# Patient Record
Sex: Male | Born: 1955 | Hispanic: Yes | Marital: Single | Smoking: Current some day smoker
Health system: Southern US, Community
[De-identification: ages and names within clinical notes are randomized; demographics above are authoritative.]

## PROBLEM LIST (undated history)

## (undated) DIAGNOSIS — I10 Essential (primary) hypertension: Secondary | ICD-10-CM

---

## 2017-10-12 ENCOUNTER — Ambulatory Visit (HOSPITAL_COMMUNITY)
Admission: EM | Admit: 2017-10-12 | Discharge: 2017-10-12 | Disposition: A | Discharge status: Home / Self Care | Payer: Self-pay | Attending: Emergency Medicine | Admitting: Emergency Medicine

## 2017-10-12 ENCOUNTER — Other Ambulatory Visit: Payer: Self-pay

## 2017-10-12 ENCOUNTER — Encounter (HOSPITAL_COMMUNITY): Payer: Self-pay | Admitting: Emergency Medicine

## 2017-10-12 DIAGNOSIS — M545 Low back pain: Secondary | ICD-10-CM

## 2017-10-12 DIAGNOSIS — S39012A Strain of muscle, fascia and tendon of lower back, initial encounter: Secondary | ICD-10-CM

## 2017-10-12 HISTORY — DX: Essential (primary) hypertension: I10

## 2017-10-12 MED ORDER — HYDROCODONE-ACETAMINOPHEN 5-325 MG PO TABS
1.0000 | ORAL_TABLET | Freq: Once | ORAL | Status: AC
  Administered 2017-10-12: 1 via ORAL

## 2017-10-12 MED ORDER — HYDROCODONE-ACETAMINOPHEN 5-325 MG PO TABS
ORAL_TABLET | ORAL | Status: AC
  Filled 2017-10-12: qty 1

## 2017-10-12 MED ORDER — KETOROLAC TROMETHAMINE 60 MG/2ML IM SOLN
INTRAMUSCULAR | Status: AC
Start: 2017-10-12 — End: 2017-10-12
  Filled 2017-10-12: qty 2

## 2017-10-12 MED ORDER — KETOROLAC TROMETHAMINE 60 MG/2ML IM SOLN
45.0000 mg | Freq: Once | INTRAMUSCULAR | Status: AC
  Administered 2017-10-12: 45 mg via INTRAMUSCULAR

## 2017-10-12 MED ORDER — NAPROXEN 375 MG PO TABS: 375.0000 mg | ORAL_TABLET | Freq: Two times a day (BID) | ORAL | 0 refills | Status: DC

## 2017-10-12 MED ORDER — TRAMADOL HCL 50 MG PO TABS: 50.0000 mg | ORAL_TABLET | Freq: Four times a day (QID) | ORAL | 0 refills | Status: DC | PRN

## 2017-10-12 NOTE — ED Provider Notes (Signed)
[facility]    CSN: [id number] Arrival date & time: 10/12/17  1434     History   Chief Complaint Chief Complaint  Patient presents with  . Back Pain  . Leg Pain    left    HPI Luke Jones is a 61 y.o. male.   Video interpreter used. 61 year old spending male presents with pain in his left low back that radiates to the foot. He states he feels like a nerve pain. This started 6 days ago. He describes it as a throbbing pain as well. He said moving makes it worse. Attempting to ambulate produces pain. The act of sitting and standing also increases pain. He has not taken any pain medications or apply other treatments. Denies trauma, falls or other known injury.      Past Medical History:  Diagnosis Date  . Hypertension     There are no active problems to display for this patient.   History reviewed. No pertinent surgical history.     Home Medications    Prior to Admission medications   Medication Sig Start Date End Date Taking? Authorizing Provider  naproxen (NAPROSYN) 375 MG tablet Take 1 tablet (375 mg total) by mouth 2 (two) times daily. 10/12/17   Hayden RasmussenMabe, Elizette Shek, NP  traMADol (ULTRAM) 50 MG tablet Take 1 tablet (50 mg total) by mouth every 6 (six) hours as needed. 10/12/17   Hayden RasmussenMabe, Donnetta Gillin, NP    Family History History reviewed. No pertinent family history.  Social History Social History   Tobacco Use  . Smoking status: Current Some Day Smoker    Types: Cigarettes  . Smokeless tobacco: Never Used  Substance Use Topics  . Alcohol use: Yes    Frequency: Never  . Drug use: No     Allergies   Patient has no known allergies.   Review of Systems Review of Systems  Constitutional: Positive for activity change.  Respiratory: Negative.   Gastrointestinal: Negative.   Genitourinary: Negative.   Musculoskeletal: Positive for back pain, gait problem and myalgias. Negative for joint swelling, neck pain and neck stiffness.       As per HPI    Skin: Negative.   Neurological: Negative for dizziness, weakness, numbness and headaches.  All other systems reviewed and are negative.    Physical Exam Triage Vital Signs ED Triage Vitals  Enc Vitals Group     BP 10/12/17 1501 119/79     Pulse Rate 10/12/17 1501 78     Resp --      Temp 10/12/17 1501 98.2 F (36.8 C)     Temp Source 10/12/17 1501 Oral     SpO2 10/12/17 1501 97 %     Weight --      Height --      Head Circumference --      Peak Flow --      Pain Score 10/12/17 1456 9     Pain Loc --      Pain Edu? --      Excl. in GC? --    No data found.  Updated Vital Signs BP 119/79 (BP Location: Left Arm)   Pulse 78   Temp 98.2 F (36.8 C) (Oral)   SpO2 97%   Visual Acuity Right Eye Distance:   Left Eye Distance:   Bilateral Distance:    Right Eye Near:   Left Eye Near:    Bilateral Near:     Physical Exam  Constitutional: He is oriented to person, place,  and time. He appears well-developed and well-nourished.  HENT:  Head: Normocephalic and atraumatic.  Eyes: EOM are normal. Left eye exhibits no discharge.  Neck: Normal range of motion. Neck supple.  Pulmonary/Chest: Effort normal.  Musculoskeletal: He exhibits tenderness. He exhibits no edema or deformity.  Marked tenderness to the left para lumbar musculature starting at the spine. Tenderness extends into the buttock as well as the posterior thigh musculature. No direct spinal tenderness, deformity, step-off deformity, swelling or discoloration.  Neurological: He is alert and oriented to person, place, and time. No cranial nerve deficit.  Skin: Skin is warm and dry.  Psychiatric: He has a normal mood and affect.  Nursing note and vitals reviewed.    UC Treatments / Results  Labs (all labs ordered are listed, but only abnormal results are displayed) Labs Reviewed - No data to display  EKG  EKG Interpretation None       Radiology No results found.  Procedures Procedures (including  critical care time)  Medications Ordered in UC Medications  ketorolac (TORADOL) injection 45 mg (not administered)  HYDROcodone-acetaminophen (NORCO/VICODIN) 5-325 MG per tablet 1 tablet (not administered)     Initial Impression / Assessment and Plan / UC Course  I have reviewed the triage vital signs and the nursing notes.  Pertinent labs & imaging results that were available during my care of the patient were reviewed by me and considered in my medical decision making (see chart for details).    Meds as directed Heat to muscles    Final Clinical Impressions(s) / UC Diagnoses   Final diagnoses:  Strain of lumbar region, initial encounter  Acute left-sided low back pain, with sciatica presence unspecified    ED Discharge Orders        Ordered    naproxen (NAPROSYN) 375 MG tablet  2 times daily     10/12/17 1645    traMADol (ULTRAM) 50 MG tablet  Every 6 hours PRN     10/12/17 1645       Controlled Substance Prescriptions [place] Controlled Substance Registry consulted? Not Applicable   Hayden RasmussenMabe, Shaundra Fullam, NP 10/12/17 [phone]

## 2017-10-12 NOTE — ED Triage Notes (Addendum)
Pt reports left lower back pain that radiates all the way down his left leg.  9/10 during the day and passes a 10/10 at night.  Interpreter Arlyn DunningMaggie Mena used for assessment.

## 2017-10-12 NOTE — Discharge Instructions (Signed)
Meds as directed Heat to muscles

## 2017-10-31 ENCOUNTER — Encounter (HOSPITAL_COMMUNITY): Payer: Self-pay | Admitting: Emergency Medicine

## 2017-10-31 ENCOUNTER — Ambulatory Visit (HOSPITAL_COMMUNITY)
Admission: EM | Admit: 2017-10-31 | Discharge: 2017-10-31 | Disposition: A | Discharge status: Home / Self Care | Payer: Self-pay | Attending: Family Medicine | Admitting: Family Medicine

## 2017-10-31 DIAGNOSIS — M5442 Lumbago with sciatica, left side: Secondary | ICD-10-CM

## 2017-10-31 DIAGNOSIS — H6121 Impacted cerumen, right ear: Secondary | ICD-10-CM

## 2017-10-31 MED ORDER — PREDNISONE 10 MG (21) PO TBPK: ORAL_TABLET | Freq: Every day | ORAL | 0 refills | Status: DC

## 2017-10-31 MED ORDER — NAPROXEN 375 MG PO TABS: 375.0000 mg | ORAL_TABLET | Freq: Two times a day (BID) | ORAL | 0 refills | Status: DC

## 2017-10-31 NOTE — Discharge Instructions (Signed)
Please take naproxen twice a day. Course of prednisone as prescribed. Please establish with a primary care provider as you may need further evaluation and treatment for this if it persists. Please go to er if develop leg weakness, numbness, or incontinence of urine or stool

## 2017-10-31 NOTE — ED Provider Notes (Signed)
[facility]    CSN: [id number] Arrival date & time: 10/31/17  1008     History   Chief Complaint Chief Complaint  Patient presents with  . Back Pain    HPI Luke Jones is a 62 y.o. male.   Handy presents with persistent low back pain which radiates down left thigh. Spanish interpreter used to collect history and physical. This has been ongoing for approximately 1 month. He was seen 12/27 and started on naproxen which minimally helped. Pain is worse with use of left leg. Denies any specific known injury. Patient is typically quite active, works in [employer]. Has not been working over the past month. Has not taken naproxen in 1 week. Denies incontinence of urine or stool. Radiating pain to left thigh with tingling sensation. Without gross numbness. Without fall or injury. Pain with ambulation but he is ambulatory. Also complains of noise to ears like they are clogged. Without headache, dizziness, neck pain, congestion. Has not had his hearing checked. Without pain or ringing to ears.    ROS per HPI.        Past Medical History:  Diagnosis Date  . Hypertension     There are no active problems to display for this patient.   History reviewed. No pertinent surgical history.     Home Medications    Prior to Admission medications   Medication Sig Start Date End Date Taking? Authorizing Provider  traMADol (ULTRAM) 50 MG tablet Take 1 tablet (50 mg total) by mouth every 6 (six) hours as needed. 10/12/17  Yes Mabe, Onalee Hua, NP  naproxen (NAPROSYN) 375 MG tablet Take 1 tablet (375 mg total) by mouth 2 (two) times daily. 10/31/17   Georgetta Haber, NP  predniSONE (STERAPRED UNI-PAK 21 TAB) 10 MG (21) TBPK tablet Take by mouth daily. Take 6 tabs by mouth daily  for 2 days, then 5 tabs for 2 days, then 4 tabs for 2 days, then 3 tabs for 2 days, 2 tabs for 2 days, then 1 tab by mouth daily for 2 days 10/31/17   Georgetta Haber, NP    Family History History  reviewed. No pertinent family history.  Social History Social History   Tobacco Use  . Smoking status: Current Some Day Smoker    Types: Cigarettes  . Smokeless tobacco: Never Used  Substance Use Topics  . Alcohol use: Yes    Frequency: Never  . Drug use: No     Allergies   Patient has no known allergies.   Review of Systems Review of Systems   Physical Exam Triage Vital Signs ED Triage Vitals  Enc Vitals Group     BP 10/31/17 1044 (!) 143/80     Pulse Rate 10/31/17 1044 62     Resp 10/31/17 1044 20     Temp 10/31/17 1044 98.2 F (36.8 C)     Temp Source 10/31/17 1044 Oral     SpO2 10/31/17 1044 97 %     Weight --      Height --      Head Circumference --      Peak Flow --      Pain Score 10/31/17 1046 9     Pain Loc --      Pain Edu? --      Excl. in GC? --    No data found.  Updated Vital Signs BP (!) 143/80 (BP Location: Left Arm)   Pulse 62   Temp 98.2 F (36.8 C) (  Oral)   Resp 20   SpO2 97%   Visual Acuity Right Eye Distance:   Left Eye Distance:   Bilateral Distance:    Right Eye Near:   Left Eye Near:    Bilateral Near:     Physical Exam  Constitutional: He is oriented to person, place, and time. He appears well-developed and well-nourished.  HENT:  Left Ear: Hearing, tympanic membrane and ear canal normal.  Right canal with cerumen occlusion;  Cardiovascular: Normal rate and regular rhythm.  Pulmonary/Chest: Effort normal and breath sounds normal.  Musculoskeletal:       Lumbar back: He exhibits tenderness, bony tenderness, spasm and abnormal pulse. He exhibits normal range of motion, no laceration and no pain.       Back:  Left midline and low back pain which radiates to hip, groin and thigh; pain with palpation; pain with left hip flexion and mildly with straight leg raise; sensation intact; strength equal bilaterally; tolerates toe and heel ambulation; equal reflexes bilaterally  Neurological: He is alert and oriented to person,  place, and time.  Skin: Skin is warm and dry.     UC Treatments / Results  Labs (all labs ordered are listed, but only abnormal results are displayed) Labs Reviewed - No data to display  EKG  EKG Interpretation None       Radiology No results found.  Procedures Procedures (including critical care time)  Medications Ordered in UC Medications - No data to display   Initial Impression / Assessment and Plan / UC Course  I have reviewed the triage vital signs and the nursing notes.  Pertinent labs & imaging results that were available during my care of the patient were reviewed by me and considered in my medical decision making (see chart for details).     Prednisone burst back, naproxen with food bid. Light regular activity. Ears irrigated for cerumen today in clinic. Recommended establish with PCP for long term managemgent of back pain as may need imaging, p/t, injections or referral in the future if pain not managed. Patient verbalized understanding and agreeable to plan.    Final Clinical Impressions(s) / UC Diagnoses   Final diagnoses:  Left-sided low back pain with left-sided sciatica, unspecified chronicity    ED Discharge Orders        Ordered    naproxen (NAPROSYN) 375 MG tablet  2 times daily     10/31/17 1110    predniSONE (STERAPRED UNI-PAK 21 TAB) 10 MG (21) TBPK tablet  Daily     10/31/17 1110       Controlled Substance Prescriptions [place] Controlled Substance Registry consulted? Not Applicable   Georgetta HaberBurky, Genell Thede B, NP 10/31/17 1127

## 2017-10-31 NOTE — ED Triage Notes (Signed)
PT C/O: persistent left hip pain that radiates down his left leg  Was seen her on 10/12/17 for similar sx and dx w/sciatic pain  Reports pain alleviated temporarily  DENIES: Inj/trauma  TAKING MEDS: Tramadol and Naproxen   A&O x4... NAD... Ambulatory

## 2017-12-30 ENCOUNTER — Encounter (HOSPITAL_COMMUNITY): Payer: Self-pay

## 2017-12-30 ENCOUNTER — Other Ambulatory Visit: Payer: Self-pay

## 2017-12-30 ENCOUNTER — Ambulatory Visit (INDEPENDENT_AMBULATORY_CARE_PROVIDER_SITE_OTHER): Payer: Self-pay

## 2017-12-30 ENCOUNTER — Ambulatory Visit (HOSPITAL_COMMUNITY)
Admission: EM | Admit: 2017-12-30 | Discharge: 2017-12-30 | Disposition: A | Discharge status: Home / Self Care | Payer: Self-pay | Attending: Family Medicine | Admitting: Family Medicine

## 2017-12-30 DIAGNOSIS — M5416 Radiculopathy, lumbar region: Secondary | ICD-10-CM

## 2017-12-30 DIAGNOSIS — M5410 Radiculopathy, site unspecified: Secondary | ICD-10-CM

## 2017-12-30 DIAGNOSIS — M545 Low back pain: Secondary | ICD-10-CM

## 2017-12-30 MED ORDER — NAPROXEN 500 MG PO TABS: 500.0000 mg | ORAL_TABLET | Freq: Two times a day (BID) | ORAL | 0 refills | Status: AC

## 2017-12-30 MED ORDER — TRAMADOL HCL 50 MG PO TABS: 50.0000 mg | ORAL_TABLET | Freq: Four times a day (QID) | ORAL | 0 refills | Status: AC | PRN

## 2017-12-30 NOTE — ED Triage Notes (Signed)
Pt presents today with left hip pain and radiates down left leg was seen for this issue on 10/31/17. Same sxs has not gotten worse but feels the same.

## 2017-12-30 NOTE — ED Provider Notes (Signed)
[facility]    CSN: [id number] Arrival date & time: 12/30/17  1539     History   Chief Complaint Chief Complaint  Patient presents with  . Leg Pain    HPI [employer] interpreter used for Spanish translation. Luke Jones is a 62 y.o. male history of hypertension presenting today with back pain.  States that he has back pain in the lower middle area of his back that radiates down into his left leg and left groin.  Denies loss of bowel or bladder control, denies saddle anesthesia.  Denies numbness or tingling, mainly just pain radiating.  Symptoms have been going on for approximately 3-4 months, initially seen here in December and given Toradol and naproxen.  Patient states that he had some pain relief with the naproxen, but pain was still present.  He was seen here in January for the same back pain, sent home with prednisone pack, patient denies any relief with this.  Patient presenting today because the pain has been persistent and no longer has any naproxen.  Patient and family member requesting x-ray.   Pain worsens when lifting left leg/walking.  Patient is using crutch to ambulate.  HPI  Past Medical History:  Diagnosis Date  . Hypertension     There are no active problems to display for this patient.   History reviewed. No pertinent surgical history.     Home Medications    Prior to Admission medications   Medication Sig Start Date End Date Taking? Authorizing Provider  naproxen (NAPROSYN) 500 MG tablet Take 1 tablet (500 mg total) by mouth 2 (two) times daily. 12/30/17   Wieters, Hallie C, PA-C  traMADol (ULTRAM) 50 MG tablet Take 1 tablet (50 mg total) by mouth every 6 (six) hours as needed for up to 3 days. 12/30/17 01/02/18  Wieters, Junius Creamer, PA-C    Family History History reviewed. No pertinent family history.  Social History Social History   Tobacco Use  . Smoking status: Current Some Day Smoker    Types: Cigarettes  . Smokeless tobacco:  Never Used  Substance Use Topics  . Alcohol use: Yes    Frequency: Never  . Drug use: No     Allergies   Patient has no known allergies.   Review of Systems Review of Systems  Respiratory: Negative for shortness of breath.   Cardiovascular: Negative for chest pain.  Gastrointestinal: Negative for abdominal pain, constipation, diarrhea, nausea and vomiting.  Genitourinary: Negative for decreased urine volume, difficulty urinating and hematuria.  Musculoskeletal: Positive for back pain, gait problem and myalgias. Negative for arthralgias.  Skin: Negative for color change and rash.  Neurological: Negative for dizziness, weakness, light-headedness, numbness and headaches.  All other systems reviewed and are negative.    Physical Exam Triage Vital Signs ED Triage Vitals  Enc Vitals Group     BP 12/30/17 1649 131/90     Pulse Rate 12/30/17 1649 68     Resp 12/30/17 1649 18     Temp 12/30/17 1649 97.6 F (36.4 C)     Temp Source 12/30/17 1649 Oral     SpO2 12/30/17 1649 95 %     Weight --      Height --      Head Circumference --      Peak Flow --      Pain Score 12/30/17 1720 9     Pain Loc --      Pain Edu? --      Excl.  in GC? --    No data found.  Updated Vital Signs BP 131/90 (BP Location: Left Arm)   Pulse 68   Temp 97.6 F (36.4 C) (Oral)   Resp 18   SpO2 95%   Visual Acuity Right Eye Distance:   Left Eye Distance:   Bilateral Distance:    Right Eye Near:   Left Eye Near:    Bilateral Near:     Physical Exam  Constitutional: He is oriented to person, place, and time. He appears well-developed and well-nourished.  Patient able to ambulate from chair to exam table with minimal difficulty  HENT:  Head: Normocephalic and atraumatic.  Eyes: Conjunctivae are normal.  Neck: Neck supple.  Cardiovascular: Normal rate and regular rhythm.  No murmur heard. Pulmonary/Chest: Effort normal. No respiratory distress.  Musculoskeletal: He exhibits no edema or  tenderness.  Pain nonreproducible on exam, nontender to palpation of thoracic and lumbar spine.  Patient pointing to lower lumbar/sacral area as source of pain.  Nontender to palpation of paraspinal muscles and lateral musculature bilaterally.  Pain is elicited with straight leg raise on left side into thigh, denies numbness or tingling with straight leg raise.  Neurological: He is alert and oriented to person, place, and time.  Skin: Skin is warm and dry.  Psychiatric: He has a normal mood and affect.  Nursing note and vitals reviewed.    UC Treatments / Results  Labs (all labs ordered are listed, but only abnormal results are displayed) Labs Reviewed - No data to display  EKG  EKG Interpretation None       Radiology Dg Lumbar Spine Complete  Result Date: 12/30/2017 CLINICAL DATA:  62 y/o M; 3 months of lower back pain. Possibly secondary to a fall. Pain originates around the L3 region radiates to the groin and down the left leg. EXAM: LUMBAR SPINE - COMPLETE 4+ VIEW COMPARISON:  None. FINDINGS: No loss of vertebral body height. Grade 1/2 L5-S1 anterolisthesis with L5 pars defects. Multilevel small anterior marginal osteophytes. Intervertebral disc space heights are maintained. Abdominal aortic calcific atherosclerosis. IMPRESSION: 1. Grade 1/2 L5-S1 anterolisthesis with L5 pars defects. 2. Vertebral body and disc space heights are maintained. 3. Aortic atherosclerosis. Electronically Signed   By: Mitzi Hansen M.D.   On: 12/30/2017 18:22    Procedures Procedures (including critical care time)  Medications Ordered in UC Medications - No data to display   Initial Impression / Assessment and Plan / UC Course  I have reviewed the triage vital signs and the nursing notes.  Pertinent labs & imaging results that were available during my care of the patient were reviewed by me and considered in my medical decision making (see chart for details).     Patient with  persistent back pain, failing anti-inflammatories and prednisone.  Will get lumbar x-ray given this is patient's third visit, and requesting.  No red flags.  Lumbar spine showing osteophytes and anterolisthesis.  Will treat pain with naproxen for mild to moderate, tramadol for severe pain.  Discussed with patient about losing weight to take strain off of his back.  Also getting set up with community health and wellness/orthopedics for further treatment and evaluation.  Final Clinical Impressions(s) / UC Diagnoses   Final diagnoses:  Back pain with left-sided radiculopathy    ED Discharge Orders        Ordered    naproxen (NAPROSYN) 500 MG tablet  2 times daily     12/30/17 1822    traMADol (  ULTRAM) 50 MG tablet  Every 6 hours PRN     12/30/17 1822       Controlled Substance Prescriptions [place] Controlled Substance Registry consulted? Not Applicable registry checked, one previous prescription in December.   Lew DawesWieters, Hallie C, [place]PA-C 12/30/17 1845

## 2017-12-30 NOTE — Discharge Instructions (Signed)
Please work on losing weight- eating healthier and exercising as tolerated to take a strain off your back.  Please use naproxen for mild- moderate pain  Please use tramadol for severe pain  Please also ice and heat back multiple times a day.   Please establish care with community health and wellness for referral to orthopedics or follow up with orthopedics.

## 2019-05-05 IMAGING — DX DG LUMBAR SPINE COMPLETE 4+V
5 series · 5 of 5 positions shown · non-contrast
Comparison: None.

CLINICAL DATA: 61 y/o M; 3 months of lower back pain. Possibly
secondary to a fall. Pain originates around the L3 region radiates
to the groin and down the left leg.

EXAM:
LUMBAR SPINE - COMPLETE 4+ VIEW

[l-spine ap]
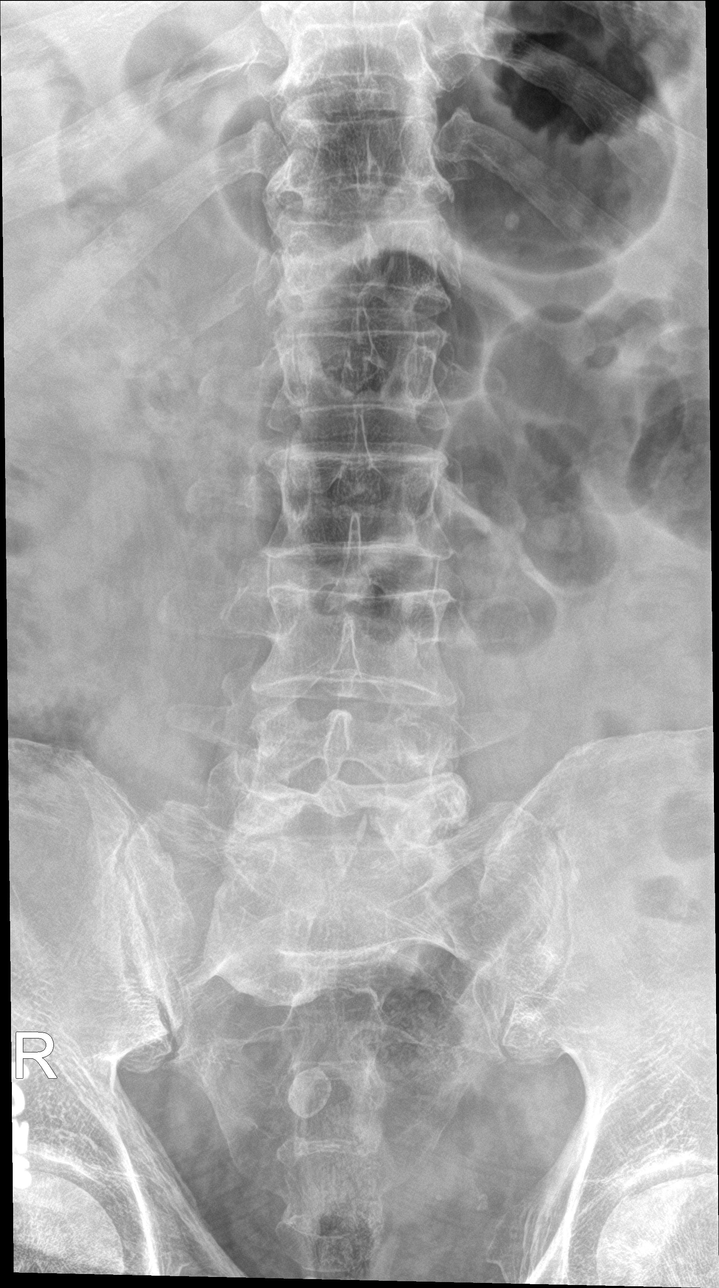

[l-spine obl (1 of 2)]
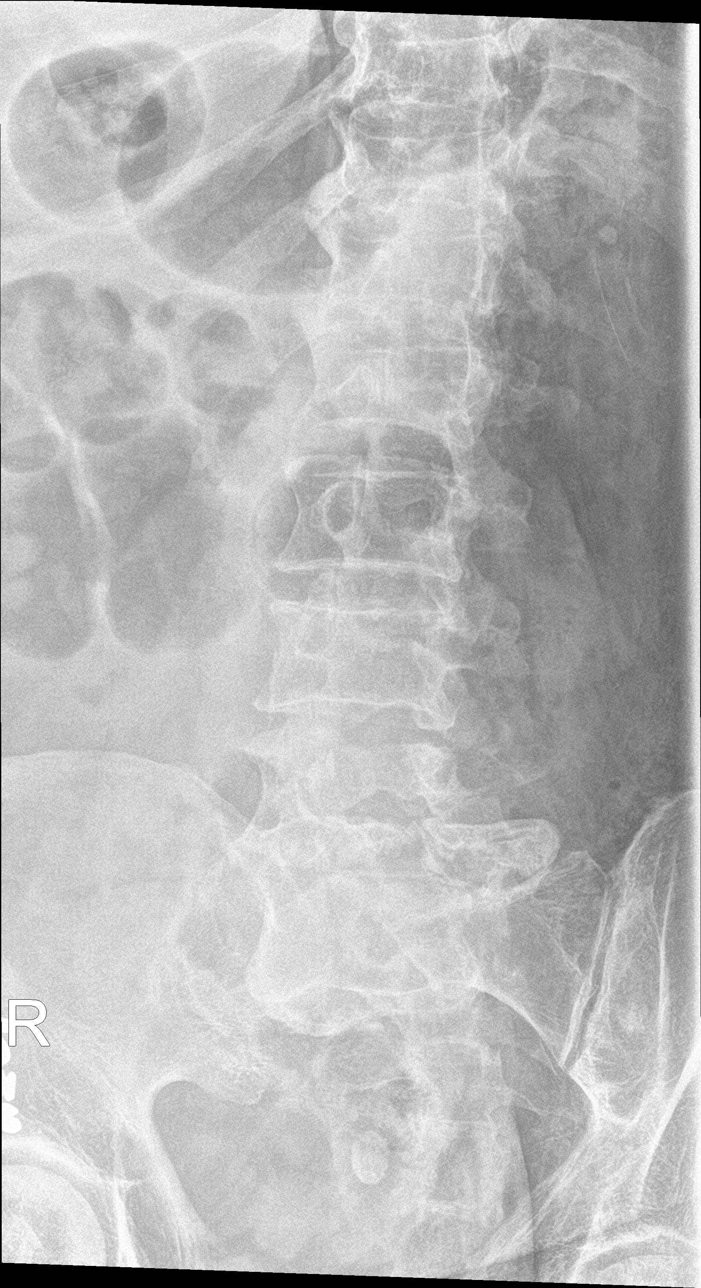

[l-spine obl (2 of 2)]
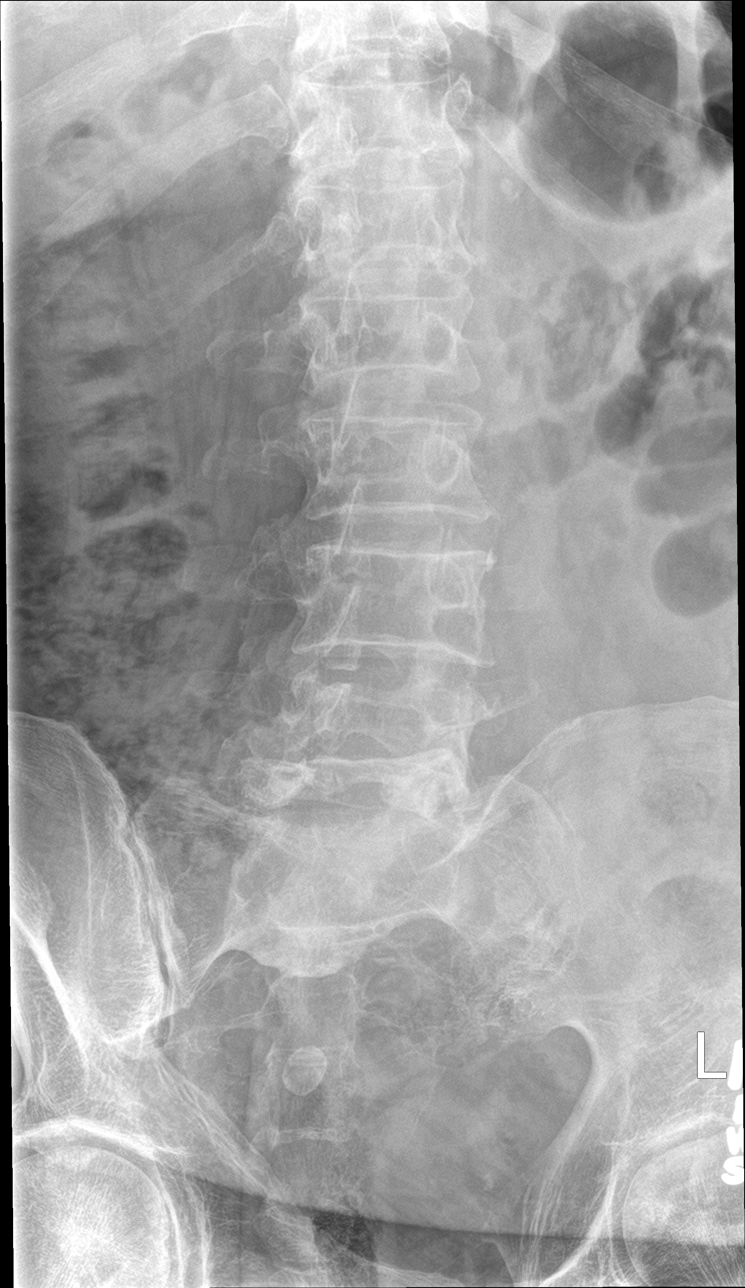

[l-spine lat]
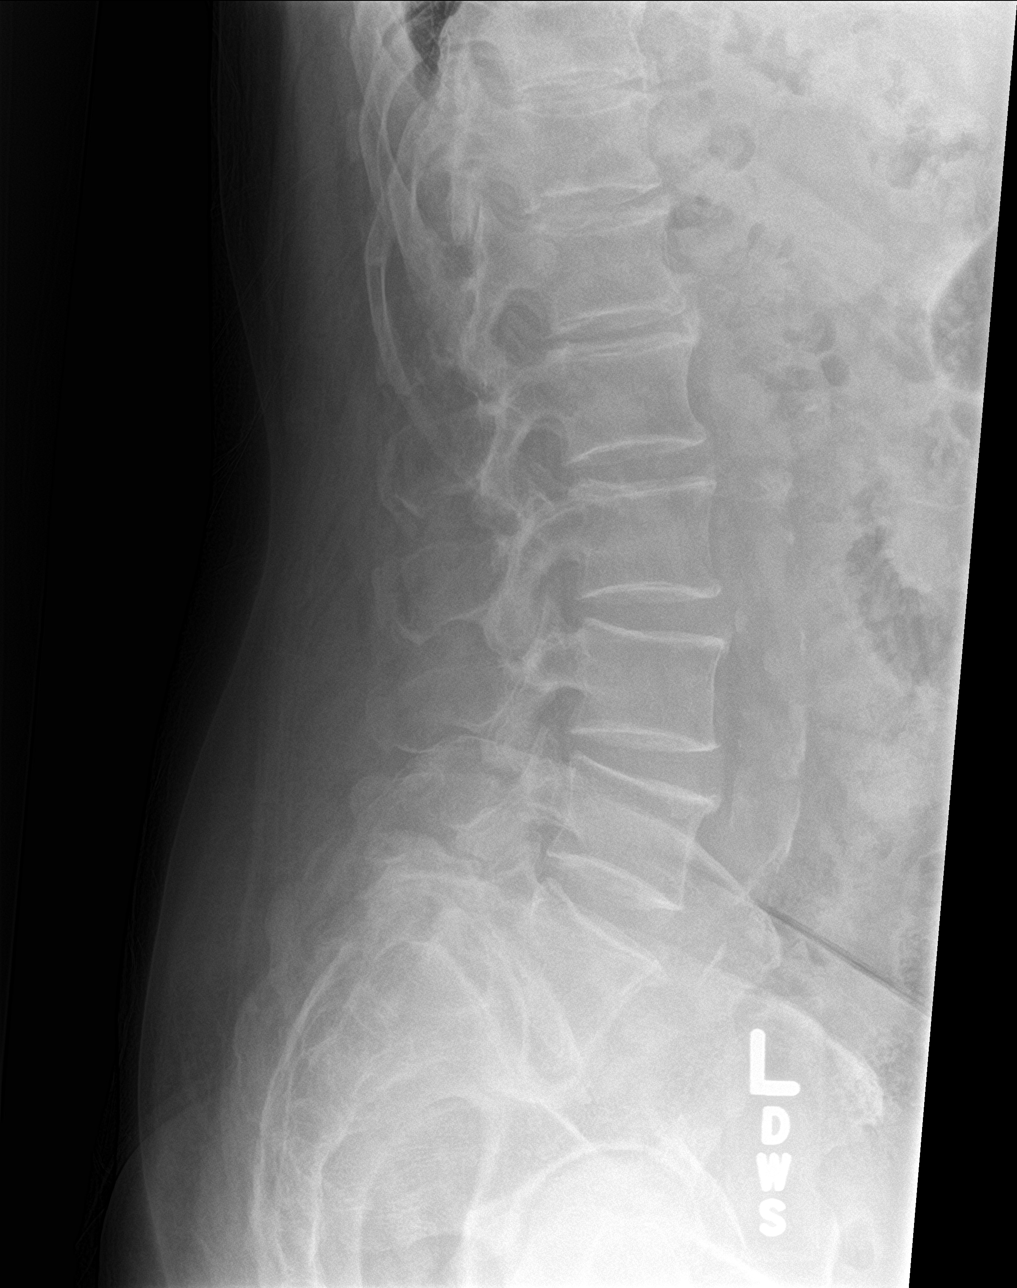

[l-spine spot]
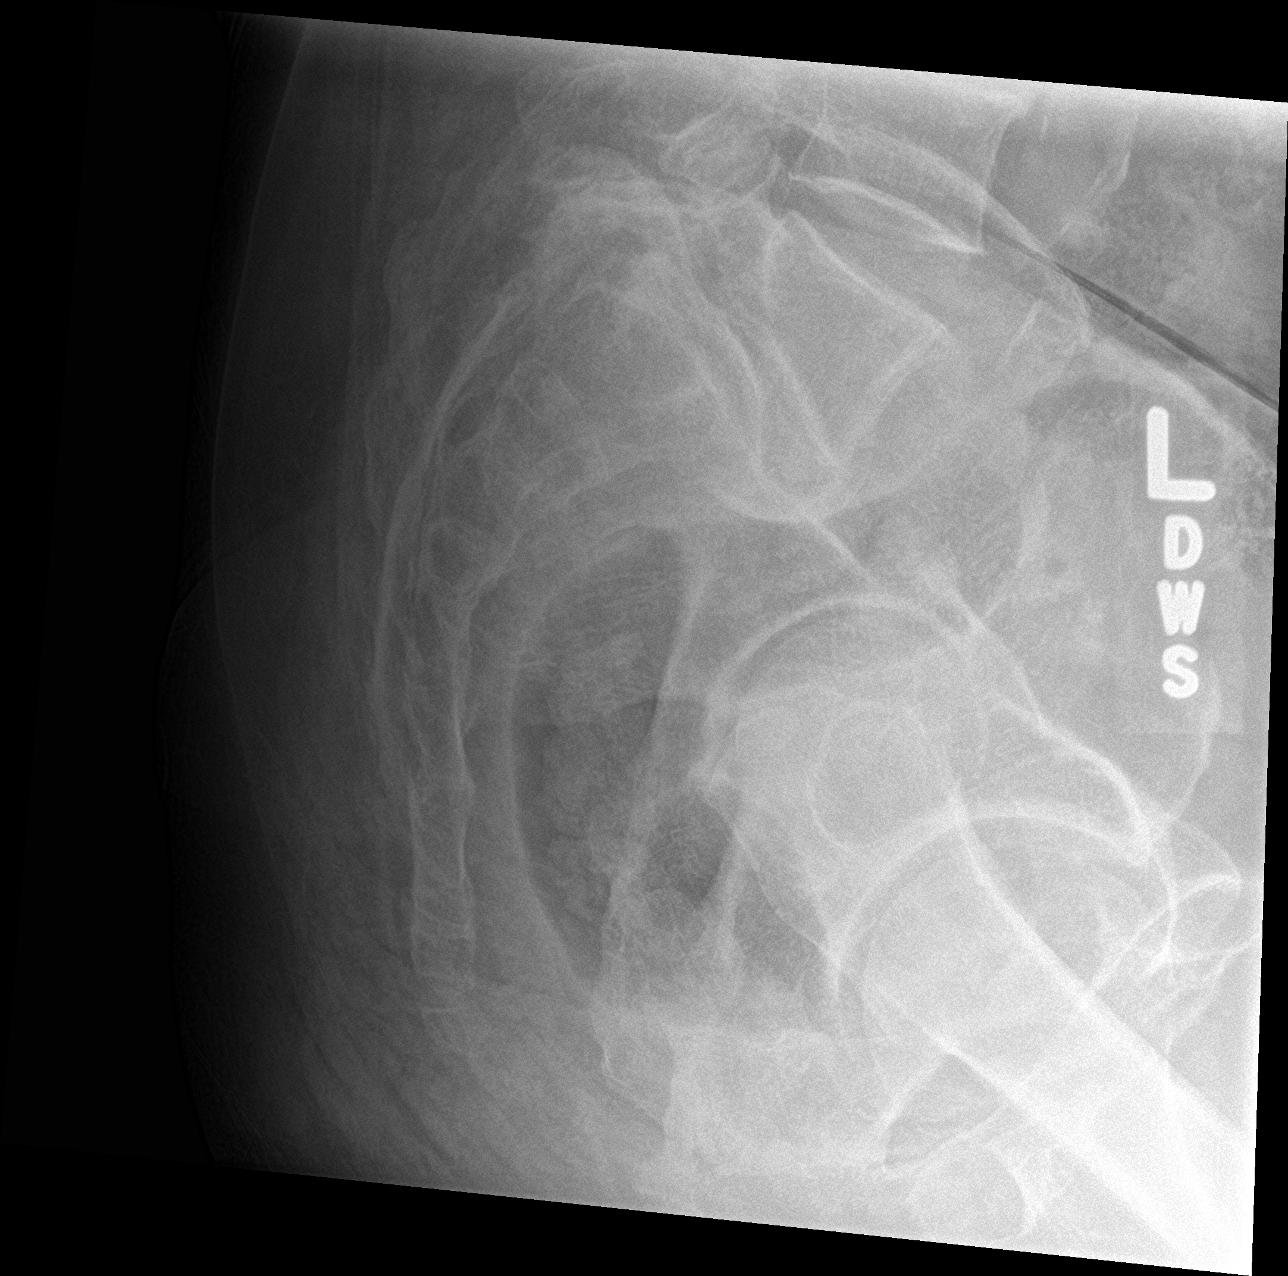

[5 of 5 positions shown; findings below may reference images not displayed]

FINDINGS: No loss of vertebral body height. Grade 1/2 L5-S1 anterolisthesis
with L5 pars defects. Multilevel small anterior marginal
osteophytes. Intervertebral disc space heights are maintained.
Abdominal aortic calcific atherosclerosis.
IMPRESSION: 1. Grade 1/2 L5-S1 anterolisthesis with L5 pars defects.
2. Vertebral body and disc space heights are maintained.
3. Aortic atherosclerosis.

By: Diocleciano Iuco M.D.
